# Patient Record
Sex: Male | Born: 1975 | Race: Black or African American | Hispanic: No | Marital: Married | Smoking: Current every day smoker
Health system: Southern US, Community
[De-identification: ages and names within clinical notes are randomized; demographics above are authoritative.]

---

## 2002-09-23 HISTORY — PX: OTHER SURGICAL HISTORY: SHX169

## 2008-07-19 ENCOUNTER — Emergency Department (HOSPITAL_BASED_OUTPATIENT_CLINIC_OR_DEPARTMENT_OTHER): Admission: EM | Admit: 2008-07-19 | Discharge: 2008-07-19 | Payer: Self-pay | Admitting: Emergency Medicine

## 2008-10-28 ENCOUNTER — Emergency Department (HOSPITAL_BASED_OUTPATIENT_CLINIC_OR_DEPARTMENT_OTHER): Admission: EM | Admit: 2008-10-28 | Discharge: 2008-10-28 | Payer: Self-pay | Admitting: Emergency Medicine

## 2008-10-28 ENCOUNTER — Ambulatory Visit: Payer: Self-pay | Admitting: Diagnostic Radiology
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
## 2012-11-09 ENCOUNTER — Encounter (HOSPITAL_BASED_OUTPATIENT_CLINIC_OR_DEPARTMENT_OTHER): Payer: Self-pay | Admitting: *Deleted

## 2012-11-09 ENCOUNTER — Emergency Department (HOSPITAL_BASED_OUTPATIENT_CLINIC_OR_DEPARTMENT_OTHER)
Admission: EM | Admit: 2012-11-09 | Discharge: 2012-11-09 | Disposition: A | Discharge status: Home / Self Care | Payer: 59 | Attending: Emergency Medicine | Admitting: Emergency Medicine

## 2012-11-09 DIAGNOSIS — IMO0001 Reserved for inherently not codable concepts without codable children: Secondary | ICD-10-CM | POA: Insufficient documentation

## 2012-11-09 DIAGNOSIS — F17200 Nicotine dependence, unspecified, uncomplicated: Secondary | ICD-10-CM | POA: Insufficient documentation

## 2012-11-09 DIAGNOSIS — R059 Cough, unspecified: Secondary | ICD-10-CM | POA: Insufficient documentation

## 2012-11-09 DIAGNOSIS — R05 Cough: Secondary | ICD-10-CM | POA: Insufficient documentation

## 2012-11-09 DIAGNOSIS — R509 Fever, unspecified: Secondary | ICD-10-CM | POA: Insufficient documentation

## 2012-11-09 DIAGNOSIS — J029 Acute pharyngitis, unspecified: Secondary | ICD-10-CM | POA: Insufficient documentation

## 2012-11-09 DIAGNOSIS — R51 Headache: Secondary | ICD-10-CM | POA: Insufficient documentation

## 2012-11-09 MED ORDER — KETOROLAC TROMETHAMINE 30 MG/ML IJ SOLN
30.0000 mg | Freq: Once | INTRAMUSCULAR | Status: AC
  Administered 2012-11-09: 30 mg via INTRAVENOUS
  Filled 2012-11-09: qty 1

## 2012-11-09 MED ORDER — LIDOCAINE VISCOUS 2 % MT SOLN
20.0000 mL | Freq: Once | OROMUCOSAL | Status: AC
  Administered 2012-11-09: 15 mL via OROMUCOSAL
  Filled 2012-11-09: qty 15

## 2012-11-09 MED ORDER — DEXAMETHASONE 1 MG/ML PO CONC
10.0000 mg | Freq: Once | ORAL | Status: AC
  Administered 2012-11-09: 10 mg via ORAL
  Filled 2012-11-09: qty 1

## 2012-11-09 NOTE — ED Provider Notes (Signed)
History  This chart was scribed for Gerhard Munch, MD by Bennett Scrape, ED Scribe. This patient was seen in room [facility]1/[facility]1 and the patient's care was started at 10:25 PM.  CSN: [id number]  Arrival date & time 11/09/12  2046   First MD Initiated Contact with Patient 11/09/12 2225      Chief Complaint  Patient presents with  . Sore Throat     The history is provided by the patient. No language interpreter was used.   Jack Cain is a 37 y.o. male who presents to the Emergency Department complaining of gradual onset, gradually worsening, constant sore throat with associated subjective fevers, generalized HA, non-productive cough and myalgias that started yesterday. He reports taking Nyquil with no improvement. He denies rash, emesis, confusion, speech difficulty, trouble swallowing and syncope as associated symptoms. He does not have a h/o chronic medical conditions and is a current everyday smoker and occasional alcohol user.   History reviewed. No pertinent past medical history.  History reviewed. No pertinent past surgical history.  No family history on file.  History  Substance Use Topics  . Smoking status: Current Every Day Smoker -- 0.50 packs/day    Types: Cigarettes  . Smokeless tobacco: Not on file  . Alcohol Use: Yes      Review of Systems  Constitutional:       Per HPI, otherwise negative  HENT:       Per HPI, otherwise negative  Respiratory:       Per HPI, otherwise negative  Cardiovascular:       Per HPI, otherwise negative  Gastrointestinal: Negative for vomiting.  Endocrine:       Negative aside from HPI  Genitourinary:       Neg aside from HPI   Musculoskeletal:       Per HPI, otherwise negative  Skin: Negative.   Neurological: Positive for headaches. Negative for syncope.    Allergies  Review of patient's allergies indicates no known allergies.  Home Medications  No current outpatient prescriptions on file.  Triage Vitals: BP 128/76   Pulse 87  Temp(Src) 99.3 F (37.4 C) (Oral)  Resp 20  Wt 240 lb (108.863 kg)  SpO2 100%  Physical Exam  Nursing note and vitals reviewed. Constitutional: He is oriented to person, place, and time. He appears well-developed and well-nourished. No distress.  HENT:  Head: Normocephalic and atraumatic.  Mild oropharyngeal erythema, no exudate  Eyes: Conjunctivae and EOM are normal.  Cardiovascular: Normal rate and regular rhythm.   Pulmonary/Chest: Effort normal and breath sounds normal. No stridor. No respiratory distress.  Abdominal: Soft. He exhibits no distension.  Musculoskeletal: He exhibits no edema.  Lymphadenopathy:    He has no cervical adenopathy.  Neurological: He is alert and oriented to person, place, and time.  Skin: Skin is warm and dry.  Psychiatric: He has a normal mood and affect.    ED Course  Procedures (including critical care time)  DIAGNOSTIC STUDIES: Oxygen Saturation is 100% on room air, normal by my interpretation.    COORDINATION OF CARE: 10:31 PM-Discussed smoking cessation with pt and pt agreed. Discussed discharge plan which includes pain medication, PO fluids and rest with pt at bedside and pt agreed to plan.   Labs Reviewed  RAPID STREP SCREEN   No results found.   No diagnosis found.    MDM   I personally performed the services described in this documentation, which was scribed in my presence. The recorded information has been reviewed  and is accurate.   The patient presents with one day of generalized complaints, primarily concerned over a sore throat.  Patient has no dysphagia, dysesthesia, no fever, and on exam is in no distress.  Patient has no exudative pharyngitis, and a negative strep test. Patient received symptomatic medication, discharged to follow up with his primary care physician  Gerhard Munch, MD 11/09/12 2247

## 2012-11-09 NOTE — ED Notes (Signed)
Sore throat. Body aches, headache and cough since yesterday.

## 2013-02-14 ENCOUNTER — Encounter (HOSPITAL_BASED_OUTPATIENT_CLINIC_OR_DEPARTMENT_OTHER): Payer: Self-pay

## 2013-02-14 ENCOUNTER — Emergency Department (HOSPITAL_BASED_OUTPATIENT_CLINIC_OR_DEPARTMENT_OTHER)
Admission: EM | Admit: 2013-02-14 | Discharge: 2013-02-14 | Disposition: A | Discharge status: Home / Self Care | Payer: PRIVATE HEALTH INSURANCE | Attending: Emergency Medicine | Admitting: Emergency Medicine

## 2013-02-14 ENCOUNTER — Emergency Department (HOSPITAL_BASED_OUTPATIENT_CLINIC_OR_DEPARTMENT_OTHER): Payer: PRIVATE HEALTH INSURANCE

## 2013-02-14 DIAGNOSIS — F17200 Nicotine dependence, unspecified, uncomplicated: Secondary | ICD-10-CM | POA: Insufficient documentation

## 2013-02-14 DIAGNOSIS — X500XXA Overexertion from strenuous movement or load, initial encounter: Secondary | ICD-10-CM | POA: Insufficient documentation

## 2013-02-14 DIAGNOSIS — S93601A Unspecified sprain of right foot, initial encounter: Secondary | ICD-10-CM

## 2013-02-14 DIAGNOSIS — Y9302 Activity, running: Secondary | ICD-10-CM | POA: Insufficient documentation

## 2013-02-14 DIAGNOSIS — Y9289 Other specified places as the place of occurrence of the external cause: Secondary | ICD-10-CM | POA: Insufficient documentation

## 2013-02-14 DIAGNOSIS — S93609A Unspecified sprain of unspecified foot, initial encounter: Secondary | ICD-10-CM | POA: Insufficient documentation

## 2013-02-14 MED ORDER — IBUPROFEN 600 MG PO TABS: 600.0000 mg | ORAL_TABLET | Freq: Four times a day (QID) | ORAL | Status: DC | PRN

## 2013-02-14 NOTE — ED Notes (Signed)
Patient here with right heel pain after reporting that he was chasing kids on pavement and pain started immediately, he heard a pop to same. On exam tender to palpation and ambulation, swelling noted to right heel. Positive pulses

## 2013-02-14 NOTE — ED Provider Notes (Signed)
History     CSN: [id number]  Arrival date & time 02/14/13  5621   First MD Initiated Contact with Patient 02/14/13 [phone]      Chief Complaint  Patient presents with  . Foot Injury    (Consider location/radiation/quality/duration/timing/severity/associated sxs/prior treatment) HPI Comments: Patient presents with right foot pain. He states yesterday he was chasing his kids on the pavement and felt a pop on the bottom of his foot. He complains of pain in this area. Then constant since yesterday. He denies any other injuries.  He has not taken anything for the pain.  Patient is a 37 y.o. male presenting with foot injury.  Foot Injury Associated symptoms: no back pain, no fever and no neck pain     History reviewed. No pertinent past medical history.  History reviewed. No pertinent past surgical history.  No family history on file.  History  Substance Use Topics  . Smoking status: Current Every Day Smoker -- 0.50 packs/day    Types: Cigarettes  . Smokeless tobacco: Not on file  . Alcohol Use: Yes      Review of Systems  Constitutional: Negative for fever.  HENT: Negative for neck pain.   Gastrointestinal: Negative for nausea and vomiting.  Musculoskeletal: Positive for joint swelling and arthralgias. Negative for back pain.  Skin: Negative for wound.  Neurological: Negative for weakness, numbness and headaches.    Allergies  Review of patient's allergies indicates no known allergies.  Home Medications   Current Outpatient Rx  Name  Route  Sig  Dispense  Refill  . ibuprofen (ADVIL,MOTRIN) 600 MG tablet   Oral   Take 1 tablet (600 mg total) by mouth every 6 (six) hours as needed for pain.   30 tablet   0     There were no vitals taken for this visit.  Physical Exam  Constitutional: He is oriented to person, place, and time. He appears well-developed and well-nourished.  HENT:  Head: Normocephalic and atraumatic.  Neck: Normal range of motion. Neck supple.   Cardiovascular: Normal rate.   Pulmonary/Chest: Effort normal.  Musculoskeletal: He exhibits edema and tenderness.  Patient has some mild swelling over his heel. He has no pain over the Achilles tendon and it feels intact. He has pain over the plantar surface of the foot at the insertion of the plantar fascia into the calcaneus. He has no other bony tenderness to the foot or the ankle. He has normal sensation in the foot. Has normal motor function in the foot. Pulses are intact.  Neurological: He is alert and oriented to person, place, and time.  Skin: Skin is warm and dry.  Psychiatric: He has a normal mood and affect.    ED Course  Procedures (including critical care time)  Labs Reviewed - No data to display Dg Foot Complete Right  02/14/2013   *RADIOLOGY REPORT*  Clinical Data: Injury  RIGHT FOOT COMPLETE - 3+ VIEW  Comparison: None.  Findings: Small spur at the inferior calcaneus.  No acute fracture and no dislocation.  IMPRESSION: No acute bony pathology.   Original Report Authenticated By: Jolaine Click, M.D.     1. Foot sprain, right, initial encounter       MDM  Patient was placed in a postop shoe. he was instructed in ice and elevation. He was instructed to use ibuprofen for pain. He's advised to followup with Dr. Pearletha Forge if his symptoms are not improving.        Rolan Bucco, MD 02/14/13  0736 

## 2013-02-23 ENCOUNTER — Encounter: Payer: Self-pay | Admitting: Family Medicine

## 2013-02-23 ENCOUNTER — Ambulatory Visit (INDEPENDENT_AMBULATORY_CARE_PROVIDER_SITE_OTHER): Payer: PRIVATE HEALTH INSURANCE | Admitting: Family Medicine

## 2013-02-23 VITALS — BP 113/79 | HR 79 | Ht 69.0 in | Wt 245.0 lb

## 2013-02-23 DIAGNOSIS — M722 Plantar fascial fibromatosis: Secondary | ICD-10-CM

## 2013-02-23 NOTE — Patient Instructions (Addendum)
You have plantar fasciitis Take tylenol or aleve as needed for pain  Plantar fascia stretch for 20-30 seconds (do 3 of these) in morning Lowering/raise on a step exercises 3 x 10 once a day - this is very important for long term recovery. Can add heel walks, toe walks forward and backward as well Ice heel for 15 minutes as needed. Avoid flat shoes/barefoot walking as much as possible. Arch straps have been shown to help with pain. Orthotics with heel lift may be helpful - try Dr. Jari Sportsman Active series insoles. Steroid injection is a consideration for short term pain relief if you are struggling. Physical therapy is also an option. Follow up with me in 6 weeks or as needed.

## 2013-02-24 ENCOUNTER — Encounter: Payer: Self-pay | Admitting: Family Medicine

## 2013-02-24 DIAGNOSIS — M722 Plantar fascial fibromatosis: Secondary | ICD-10-CM | POA: Insufficient documentation

## 2013-02-24 NOTE — Assessment & Plan Note (Signed)
Left plantar fasciitis - home exercise program demonstrated.  Icing, tylenol/aleve as needed.  Avoid flip flops and barefoot walking.  Arch straps.  Discussed OTC insoles with arch support.  Consider PT, injection if not improving as expected.  F/u in 6 weeks.

## 2013-02-24 NOTE — Progress Notes (Signed)
  Subjective:    Patient ID: Jack Cain, male    DOB: [birth year], 37 y.o.   MRN: [id number]  PCP: None  HPI 37 yo M here for left foot/heel pain.  Patient states he started getting pain in right > left heel about 1 year ago. Now left side is worse than right. Pain worse in mornings and eases up during day. Flared up about 2 weeks ago when he stopped a short run forward. Some swelling but no bruising. Stands a lot for work. Has been icing. Not tried any exercises, inserts. Had an injection remotely without much benefit.  History reviewed. No pertinent past medical history.  Current Outpatient Prescriptions on File Prior to Visit  Medication Sig Dispense Refill  . ibuprofen (ADVIL,MOTRIN) 600 MG tablet Take 1 tablet (600 mg total) by mouth every 6 (six) hours as needed for pain.  30 tablet  0   No current facility-administered medications on file prior to visit.    History reviewed. No pertinent past surgical history.  No Known Allergies  History   Social History  . Marital Status: Married    Spouse Name: N/A    Number of Children: N/A  . Years of Education: N/A   Occupational History  . Not on file.   Social History Main Topics  . Smoking status: Current Every Day Smoker -- 0.50 packs/day    Types: Cigarettes  . Smokeless tobacco: Not on file  . Alcohol Use: Yes  . Drug Use: No  . Sexually Active: Not on file   Other Topics Concern  . Not on file   Social History Narrative  . No narrative on file    Family History  Problem Relation Age of Onset  . Sudden death Neg Hx   . Hypertension Neg Hx   . Hyperlipidemia Neg Hx   . Heart attack Neg Hx   . Diabetes Neg Hx     BP 113/79  Pulse 79  Ht 5\' 9"  (1.753 m)  Wt 245 lb (111.131 kg)  BMI 36.16 kg/m2  Review of Systems See HPI above.    Objective:   Physical Exam Gen: NAD  L foot/ankle: No gross deformity, swelling, ecchymoses Mod overpronation. FROM with 5/5 strength all directions. TTP  plantar anterior calcaneus at PF insertion. Negative ant drawer and talar tilt.   Negative syndesmotic compression. Thompsons test negative. NV intact distally.    Assessment & Plan:  1. Left plantar fasciitis - home exercise program demonstrated.  Icing, tylenol/aleve as needed.  Avoid flip flops and barefoot walking.  Arch straps.  Discussed OTC insoles with arch support.  Consider PT, injection if not improving as expected.  F/u in 6 weeks.

## 2013-03-16 ENCOUNTER — Other Ambulatory Visit: Payer: Self-pay | Admitting: Urology

## 2013-04-21 ENCOUNTER — Encounter (HOSPITAL_BASED_OUTPATIENT_CLINIC_OR_DEPARTMENT_OTHER): Payer: Self-pay | Admitting: *Deleted

## 2013-04-21 NOTE — Progress Notes (Signed)
NPO AFTER MN. ARRIVES AT 0700. NEEDS HG. 

## 2013-04-24 ENCOUNTER — Emergency Department (HOSPITAL_BASED_OUTPATIENT_CLINIC_OR_DEPARTMENT_OTHER)
Admission: EM | Admit: 2013-04-24 | Discharge: 2013-04-24 | Disposition: A | Discharge status: Home / Self Care | Payer: PRIVATE HEALTH INSURANCE | Attending: Emergency Medicine | Admitting: Emergency Medicine

## 2013-04-24 ENCOUNTER — Encounter (HOSPITAL_BASED_OUTPATIENT_CLINIC_OR_DEPARTMENT_OTHER): Payer: Self-pay | Admitting: *Deleted

## 2013-04-24 DIAGNOSIS — H109 Unspecified conjunctivitis: Secondary | ICD-10-CM | POA: Insufficient documentation

## 2013-04-24 DIAGNOSIS — F17200 Nicotine dependence, unspecified, uncomplicated: Secondary | ICD-10-CM | POA: Insufficient documentation

## 2013-04-24 DIAGNOSIS — H5789 Other specified disorders of eye and adnexa: Secondary | ICD-10-CM | POA: Insufficient documentation

## 2013-04-24 DIAGNOSIS — H579 Unspecified disorder of eye and adnexa: Secondary | ICD-10-CM | POA: Insufficient documentation

## 2013-04-24 MED ORDER — ERYTHROMYCIN 5 MG/GM OP OINT: TOPICAL_OINTMENT | OPHTHALMIC | Status: AC

## 2013-04-24 NOTE — ED Notes (Signed)
Right eye irritation/pink for the last week, congestion in eye when he wakes up

## 2013-04-24 NOTE — ED Provider Notes (Signed)
  CSN: [id number]     Arrival date & time 04/24/13  0908 History     First MD Initiated Contact with Patient 04/24/13 0915     Chief Complaint  Patient presents with  . Eye Pain    Patient is a 37 y.o. male presenting with eye pain. The history is provided by the patient.  Eye Pain This is a new problem. The current episode started more than 1 week ago. The problem occurs constantly. The problem has not changed since onset.Pertinent negatives include no headaches. Nothing aggravates the symptoms. Nothing relieves the symptoms.  denies trauma He does not wear contact lenses Reports discharge in right eye every morning  PMH - none Past Surgical History  Procedure Laterality Date  . Excision right wrist ganglion cyst  2004   Family History  Problem Relation Age of Onset  . Sudden death Neg Hx   . Hypertension Neg Hx   . Hyperlipidemia Neg Hx   . Heart attack Neg Hx   . Diabetes Neg Hx    History  Substance Use Topics  . Smoking status: Current Every Day Smoker -- 1.00 packs/day for 7 years    Types: Cigarettes  . Smokeless tobacco: Not on file  . Alcohol Use: Yes     Comment: OCCASIONAL    Review of Systems  Constitutional: Negative for fever.  Eyes: Positive for pain, discharge, redness and itching. Negative for photophobia and visual disturbance.  Neurological: Negative for headaches.    Allergies  Review of patient's allergies indicates no known allergies.  Home Medications   Current Outpatient Rx  Name  Route  Sig  Dispense  Refill  . erythromycin ophthalmic ointment      Place a 1/2 inch ribbon of ointment into the lower eyelid.   1 g   0    BP 121/86  Pulse 66  Temp(Src) 98.1 F (36.7 C)  SpO2 96% Physical Exam CONSTITUTIONAL: Well developed/well nourished HEAD AND FACE: Normocephalic/atraumatic EYES: EOMI/PERRL. No consensual pain. No proptosis.  No foreign body noted in OD.  Yellowish discharge noted in right lid margin.  Mild conjunctival erythema  noted in OD.  No photophobia ENMT: Mucous membranes moist.  NECK: supple no meningeal signs LUNGS:  no apparent distress ABDOMEN: soft NEURO: Pt is awake/alert, moves all extremitiesx4 EXTREMITIES:full ROM SKIN: warm, color normal  ED Course   Procedures 1. Conjunctivitis     MDM  Nursing notes including past medical history and social history reviewed and considered in documentation  I doubt iritis.  Will treat with erythromycin and f/u with ophtho if not improved in one week  Joya Gaskins, MD 04/24/13 1002

## 2013-04-27 ENCOUNTER — Encounter (HOSPITAL_BASED_OUTPATIENT_CLINIC_OR_DEPARTMENT_OTHER): Payer: Self-pay | Admitting: Anesthesiology

## 2013-04-27 NOTE — Anesthesia Preprocedure Evaluation (Deleted)
Anesthesia Evaluation  Patient identified by MRN, date of birth, ID band Patient awake    Reviewed: Allergy & Precautions, H&P , NPO status , Patient's Chart, lab work & pertinent test results  Airway Mallampati: II TM Distance: >3 FB Neck ROM: Full    Dental no notable dental hx.    Pulmonary Current Smoker,  breath sounds clear to auscultation  Pulmonary exam normal       Cardiovascular Exercise Tolerance: Good negative cardio ROS  Rhythm:Regular Rate:Normal     Neuro/Psych negative neurological ROS  negative psych ROS   GI/Hepatic negative GI ROS, Neg liver ROS,   Endo/Other  negative endocrine ROS  Renal/GU negative Renal ROS  negative genitourinary   Musculoskeletal negative musculoskeletal ROS (+)   Abdominal   Peds negative pediatric ROS (+)  Hematology negative hematology ROS (+)   Anesthesia Other Findings   Reproductive/Obstetrics negative OB ROS                           Anesthesia Physical Anesthesia Plan  ASA: II  Anesthesia Plan: MAC   Post-op Pain Management:    Induction: Intravenous  Airway Management Planned:   Additional Equipment:   Intra-op Plan:   Post-operative Plan:   Informed Consent: I have reviewed the patients History and Physical, chart, labs and discussed the procedure including the risks, benefits and alternatives for the proposed anesthesia with the patient or authorized representative who has indicated his/her understanding and acceptance.   Dental advisory given  Plan Discussed with: CRNA  Anesthesia Plan Comments:        Anesthesia Quick Evaluation

## 2013-04-28 ENCOUNTER — Ambulatory Visit (HOSPITAL_BASED_OUTPATIENT_CLINIC_OR_DEPARTMENT_OTHER): Admission: RE | Admit: 2013-04-28 | Payer: 59 | Source: Ambulatory Visit | Admitting: Urology

## 2013-04-28 SURGERY — VASECTOMY
Anesthesia: Monitor Anesthesia Care | Laterality: Bilateral

## 2013-04-28 NOTE — Progress Notes (Signed)
Patient was scheduled for bilateral vasectomy in the OR 04/28/13. He did not show for the surgery. Attempts to contact the patient were unsuccessful.

## 2014-05-16 ENCOUNTER — Encounter (HOSPITAL_BASED_OUTPATIENT_CLINIC_OR_DEPARTMENT_OTHER): Payer: Self-pay | Admitting: Emergency Medicine

## 2014-05-16 ENCOUNTER — Emergency Department (HOSPITAL_BASED_OUTPATIENT_CLINIC_OR_DEPARTMENT_OTHER)
Admission: EM | Admit: 2014-05-16 | Discharge: 2014-05-16 | Disposition: A | Discharge status: Home / Self Care | Payer: BC Managed Care – PPO | Attending: Emergency Medicine | Admitting: Emergency Medicine

## 2014-05-16 DIAGNOSIS — J029 Acute pharyngitis, unspecified: Secondary | ICD-10-CM | POA: Insufficient documentation

## 2014-05-16 DIAGNOSIS — F17200 Nicotine dependence, unspecified, uncomplicated: Secondary | ICD-10-CM | POA: Insufficient documentation

## 2014-05-16 DIAGNOSIS — Z791 Long term (current) use of non-steroidal anti-inflammatories (NSAID): Secondary | ICD-10-CM | POA: Diagnosis not present

## 2014-05-16 LAB — RAPID STREP SCREEN (MED CTR MEBANE ONLY): Streptococcus, Group A Screen (Direct): NEGATIVE

## 2014-05-16 MED ORDER — IBUPROFEN 800 MG PO TABS
800.0000 mg | ORAL_TABLET | Freq: Once | ORAL | Status: AC
  Administered 2014-05-16: 800 mg via ORAL
  Filled 2014-05-16: qty 1

## 2014-05-16 MED ORDER — NAPROXEN 500 MG PO TABS: 500.0000 mg | ORAL_TABLET | Freq: Two times a day (BID) | ORAL | Status: AC

## 2014-05-16 NOTE — ED Notes (Signed)
Pt reports sore throat that began last Thursday, pain improved and then returned yesterday - pt admits to a productive cough w/ yellow sputum as well. Unsure of any fever.

## 2014-05-16 NOTE — ED Provider Notes (Signed)
CSN: [id number]     Arrival date & time 05/16/14  0043 History   First MD Initiated Contact with Patient 05/16/14 0103     Chief Complaint  Patient presents with  . Sore Throat     (Consider location/radiation/quality/duration/timing/severity/associated sxs/prior Treatment) Patient is a 38 y.o. male presenting with pharyngitis. The history is provided by the patient.  Sore Throat This is a new problem. The current episode started more than 2 days ago. The problem occurs constantly. The problem has not changed since onset.Pertinent negatives include no chest pain, no abdominal pain, no headaches and no shortness of breath. Nothing aggravates the symptoms. Nothing relieves the symptoms. He has tried nothing for the symptoms. The treatment provided no relief.    History reviewed. No pertinent past medical history. Past Surgical History  Procedure Laterality Date  . Excision right wrist ganglion cyst  2004   Family History  Problem Relation Age of Onset  . Sudden death Neg Hx   . Hypertension Neg Hx   . Hyperlipidemia Neg Hx   . Heart attack Neg Hx   . Diabetes Neg Hx    History  Substance Use Topics  . Smoking status: Current Every Day Smoker -- 1.00 packs/day for 7 years    Types: Cigarettes  . Smokeless tobacco: Not on file  . Alcohol Use: Yes     Comment: OCCASIONAL    Review of Systems  Constitutional: Negative for fever.  Respiratory: Negative for shortness of breath.   Cardiovascular: Negative for chest pain.  Gastrointestinal: Negative for abdominal pain.  Neurological: Negative for headaches.  All other systems reviewed and are negative.     Allergies  Review of patient's allergies indicates no known allergies.  Home Medications   Prior to Admission medications   Medication Sig Start Date End Date Taking? Authorizing Provider  erythromycin ophthalmic ointment Place a 1/2 inch ribbon of ointment into the lower eyelid. 04/24/13   Joya Gaskins, MD   naproxen (NAPROSYN) 500 MG tablet Take 1 tablet (500 mg total) by mouth 2 (two) times daily. 05/16/14   Ridwan Bondy K Valree Feild-Rasch, MD   BP 129/81  Pulse 69  Temp(Src) 98 F (36.7 C) (Oral)  Resp 20  Ht  (1.753 m)  Wt 250 lb (113.399 kg)  BMI 36.90 kg/m2  SpO2 100% Physical Exam  Constitutional: He is oriented to person, place, and time. He appears well-developed and well-nourished. No distress.  HENT:  Head: Normocephalic and atraumatic.  Mouth/Throat: Oropharynx is clear and moist. No oropharyngeal exudate.  Eyes: Conjunctivae are normal. Pupils are equal, round, and reactive to light.  Neck: Normal range of motion.  No pain with displacement of the trachea, intact phonation  Cardiovascular: Normal rate, regular rhythm and intact distal pulses.   Pulmonary/Chest: Effort normal and breath sounds normal. He has no wheezes. He has no rales.  Abdominal: Soft. Bowel sounds are normal. There is no tenderness. There is no rebound and no guarding.  Musculoskeletal: Normal range of motion.  Lymphadenopathy:    He has no cervical adenopathy.  Neurological: He is alert and oriented to person, place, and time. He has normal reflexes.  Skin: Skin is warm and dry.  Psychiatric: He has a normal mood and affect.    ED Course  Procedures (including critical care time) Labs Review Labs Reviewed  RAPID STREP SCREEN  CULTURE, GROUP A STREP    Imaging Review No results found.   EKG Interpretation None      MDM  Final diagnoses:  Viral pharyngitis    Viral pharyngitis  NSAIDs and salt water gargles    Shalawn Wynder K Filiberto Wamble-Rasch, MD 05/16/14 2306

## 2014-05-16 NOTE — ED Notes (Signed)
Pt ambulating independently w/ steady gait on d/c in no acute distress, A&Ox4. D/c instructions reviewed w/ pt - pt denies any further questions or concerns at present. Rx given x1  

## 2014-05-18 LAB — CULTURE, GROUP A STREP

## 2017-12-26 ENCOUNTER — Encounter (HOSPITAL_BASED_OUTPATIENT_CLINIC_OR_DEPARTMENT_OTHER): Payer: Self-pay | Admitting: *Deleted

## 2017-12-26 ENCOUNTER — Emergency Department (HOSPITAL_BASED_OUTPATIENT_CLINIC_OR_DEPARTMENT_OTHER)
Admission: EM | Admit: 2017-12-26 | Discharge: 2017-12-26 | Disposition: A | Discharge status: Home / Self Care | Payer: 59 | Attending: Emergency Medicine | Admitting: Emergency Medicine

## 2017-12-26 ENCOUNTER — Emergency Department (HOSPITAL_BASED_OUTPATIENT_CLINIC_OR_DEPARTMENT_OTHER): Payer: 59

## 2017-12-26 ENCOUNTER — Other Ambulatory Visit: Payer: Self-pay

## 2017-12-26 DIAGNOSIS — Z79899 Other long term (current) drug therapy: Secondary | ICD-10-CM | POA: Diagnosis not present

## 2017-12-26 DIAGNOSIS — J069 Acute upper respiratory infection, unspecified: Secondary | ICD-10-CM | POA: Insufficient documentation

## 2017-12-26 DIAGNOSIS — F17210 Nicotine dependence, cigarettes, uncomplicated: Secondary | ICD-10-CM | POA: Diagnosis not present

## 2017-12-26 DIAGNOSIS — R05 Cough: Secondary | ICD-10-CM | POA: Diagnosis present

## 2017-12-26 MED ORDER — AZITHROMYCIN 250 MG PO TABS: 250.0000 mg | ORAL_TABLET | Freq: Every day | ORAL | 0 refills | Status: AC

## 2017-12-26 MED ORDER — FLUTICASONE PROPIONATE 50 MCG/ACT NA SUSP: 2.0000 | Freq: Every day | NASAL | 0 refills | Status: AC

## 2017-12-26 NOTE — ED Provider Notes (Signed)
[facility] EMERGENCY DEPARTMENT Provider Note   CSN: [id number] Arrival date & time: 12/26/17  2149     History   Chief Complaint Chief Complaint  Patient presents with  . Cough    HPI Jack Cain is a 42 y.o. male.  HPI   Patient is a 42 year old male presents the ED today complaining of cough, nasal congestion, rhinorrhea, sore throat, body aches, fevers that began 5 days ago.  Cough has been productive of yellow/green sputum.  No hemoptysis.  States he has not had a fever in 4 days.  States his symptoms do seem to be improving.  Has been able to tolerate p.o. at home.  States he has had a raspy voice, has not lost his voice.  States her throat is not severe.  Is also complaining of bilateral ear pain.  No chest pain, shortness of breath, abdominal pain, nausea, vomiting, diarrhea, back pain.  Patient smokes tobacco  History reviewed. No pertinent past medical history.  Patient Active Problem List   Diagnosis Date Noted  . Plantar fasciitis of left foot 02/24/2013    Past Surgical History:  Procedure Laterality Date  . EXCISION RIGHT WRIST GANGLION CYST  2004        Home Medications    Prior to Admission medications   Medication Sig Start Date End Date Taking? Authorizing Provider  azithromycin (ZITHROMAX Z-PAK) 250 MG tablet Take 1 tablet (250 mg total) by mouth daily. Take 2 tablets on the first day of treatment. Then take 1 tablet per day for the next four days. 12/26/17   Shann Lewellyn S, PA-C  erythromycin ophthalmic ointment Place a 1/2 inch ribbon of ointment into the lower eyelid. 04/24/13   Zadie Rhine, MD  fluticasone (FLONASE) 50 MCG/ACT nasal spray Place 2 sprays into both nostrils daily. 12/26/17   Quaneshia Wareing S, PA-C  naproxen (NAPROSYN) 500 MG tablet Take 1 tablet (500 mg total) by mouth 2 (two) times daily. 05/16/14   Palumbo, April, MD    Family History Family History  Problem Relation Age of Onset  . Sudden death Neg Hx   .  Hypertension Neg Hx   . Hyperlipidemia Neg Hx   . Heart attack Neg Hx   . Diabetes Neg Hx     Social History Social History   Tobacco Use  . Smoking status: Current Every Day Smoker    Packs/day: 1.00    Years: 7.00    Pack years: 7.00    Types: Cigarettes  Substance Use Topics  . Alcohol use: Yes    Comment: OCCASIONAL  . Drug use: No     Allergies   Patient has no known allergies.   Review of Systems Review of Systems  Constitutional: Positive for fever.  HENT: Positive for congestion, ear pain, postnasal drip, rhinorrhea and sore throat. Negative for trouble swallowing.   Eyes: Negative for visual disturbance.  Respiratory: Positive for cough. Negative for shortness of breath.   Cardiovascular: Negative for chest pain and palpitations.  Gastrointestinal: Negative for abdominal pain, constipation, diarrhea, nausea and vomiting.  Genitourinary: Negative for flank pain.  Musculoskeletal: Negative for back pain and neck pain.  Skin: Negative for rash.  Neurological: Negative for dizziness, weakness, light-headedness, numbness and headaches.     Physical Exam Updated Vital Signs BP 113/75 (BP Location: Right Arm)   Pulse 65   Temp 98.5 F (36.9 C) (Oral)   Resp 18   Ht 5\' 9"  (1.753 m)   Wt 131.5 kg (  290 lb)   SpO2 98%   BMI 42.83 kg/m   Physical Exam  Constitutional: He is oriented to person, place, and time. He appears well-developed and well-nourished.  Non-Toxic, no acute distress  HENT:  Head: Normocephalic and atraumatic.  Lateral TMs without erythema.  Right TM is bulging.  Bilateral nasal turbinates swollen.  Pharyngeal erythema noted.  Mild tonsillar swelling, no tonsillar kissing.  No tonsillar exudate.  Uvula midline.  No evidence of PTA.  No hot potato voice.  Tolerating secretions.  Patent airway.  Eyes: Pupils are equal, round, and reactive to light. Conjunctivae and EOM are normal.  Neck: Normal range of motion. Neck supple.  Cardiovascular:  Normal rate, regular rhythm and normal heart sounds.  No murmur heard. Pulmonary/Chest: Effort normal and breath sounds normal. No respiratory distress. He has no wheezes.  Abdominal: Soft. Bowel sounds are normal. He exhibits no distension. There is no tenderness.  Musculoskeletal: He exhibits no edema.  Lymphadenopathy:    He has no cervical adenopathy.  Neurological: He is alert and oriented to person, place, and time.  Skin: Skin is warm and dry. Capillary refill takes less than 2 seconds.  Psychiatric: He has a normal mood and affect.  Nursing note and vitals reviewed.    ED Treatments / Results  Labs (all labs ordered are listed, but only abnormal results are displayed) Labs Reviewed - No data to display  EKG None  Radiology Dg Chest 2 View  Result Date: 12/26/2017 CLINICAL DATA:  Fever, cough, and sore throat for the past 4 days. EXAM: CHEST - 2 VIEW COMPARISON:  None. FINDINGS: The heart size and mediastinal contours are within normal limits. Both lungs are clear. The visualized skeletal structures are unremarkable. IMPRESSION: No active cardiopulmonary disease. Electronically Signed   By: Obie DredgeWilliam T Derry M.D.   On: 12/26/2017 23:15    Procedures Procedures (including critical care time)  Medications Ordered in ED Medications - No data to display   Initial Impression / Assessment and Plan / ED Course  I have reviewed the triage vital signs and the nursing notes.  Pertinent labs & imaging results that were available during my care of the patient were reviewed by me and considered in my medical decision making (see chart for details).      Final Clinical Impressions(s) / ED Diagnoses   Final diagnoses:  Upper respiratory tract infection, unspecified type   Pt CXR negative for acute infiltrate. Patients symptoms are consistent with URI, likely viral etiology.  Will give Rx for Zithromax given patient has history of tobacco use.  Will also give Flonase for nasal  congestion.  Pt will be discharged with symptomatic treatment.  Verbalizes understanding and is agreeable with plan. CENTOR score1. Advised follow-up with PCP in 1 week. pt is hemodynamically stable & in NAD prior to dc.  ED Discharge Orders        Ordered    fluticasone (FLONASE) 50 MCG/ACT nasal spray  Daily     12/26/17 2335    azithromycin (ZITHROMAX Z-PAK) 250 MG tablet  Daily     12/26/17 2335       Karrie MeresCouture, Dallyn Bergland S, PA-C 12/26/17 2336    Terrilee FilesButler, Michael C, MD 12/27/17 1130

## 2017-12-26 NOTE — ED Notes (Signed)
Patient transported to X-ray 

## 2017-12-26 NOTE — ED Triage Notes (Signed)
Pt reports onset of coughing on Tuesday followed by body aches, sore throat, and nasal congestion. Last had Tylenol about 4 hours ago.

## 2017-12-26 NOTE — Discharge Instructions (Signed)

## 2019-12-03 IMAGING — DX DG CHEST 2V
2 series · 2 of 2 positions shown · non-contrast
Comparison: None.

CLINICAL DATA: Fever, cough, and sore throat for the past 4 days.

EXAM:
CHEST - 2 VIEW

[chest pa]
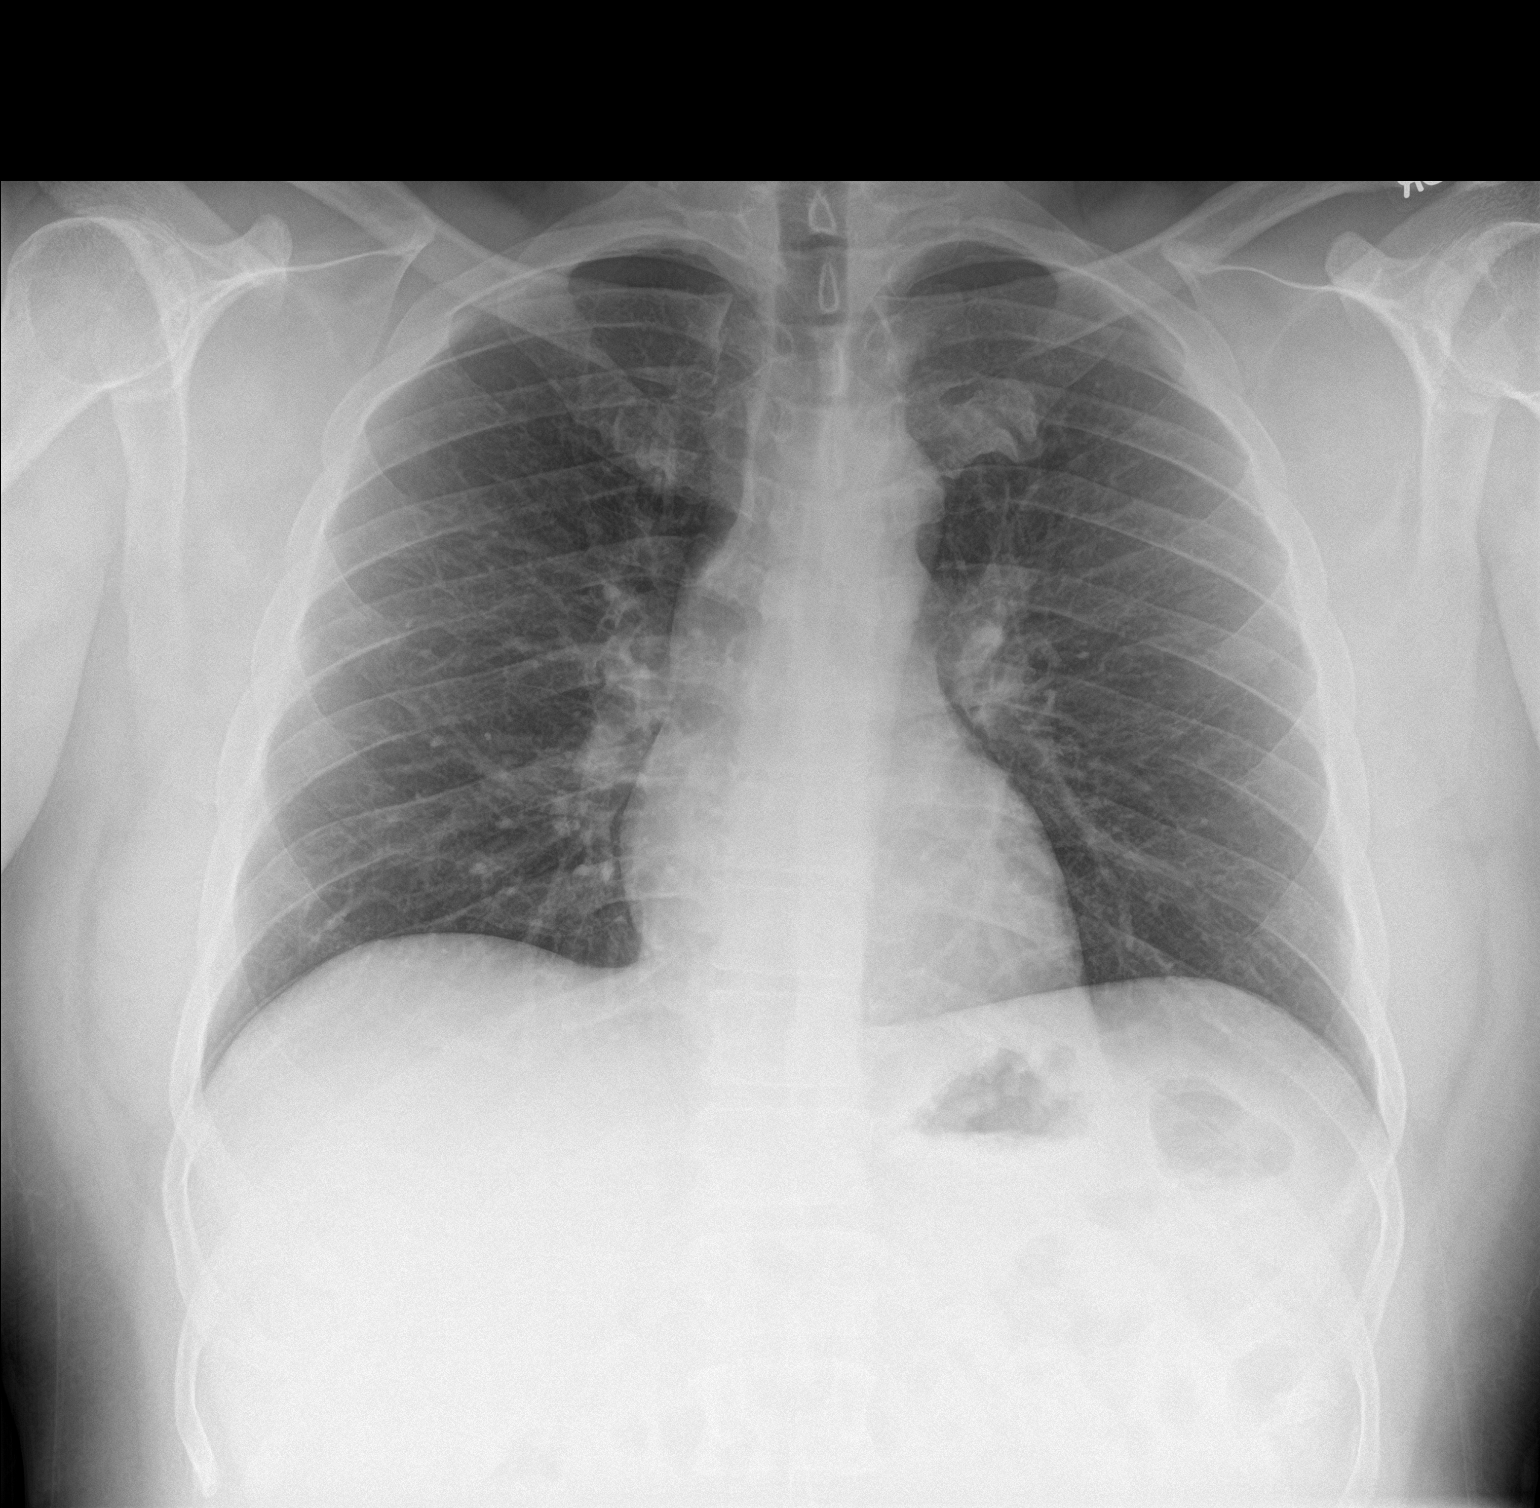

[chest lat]
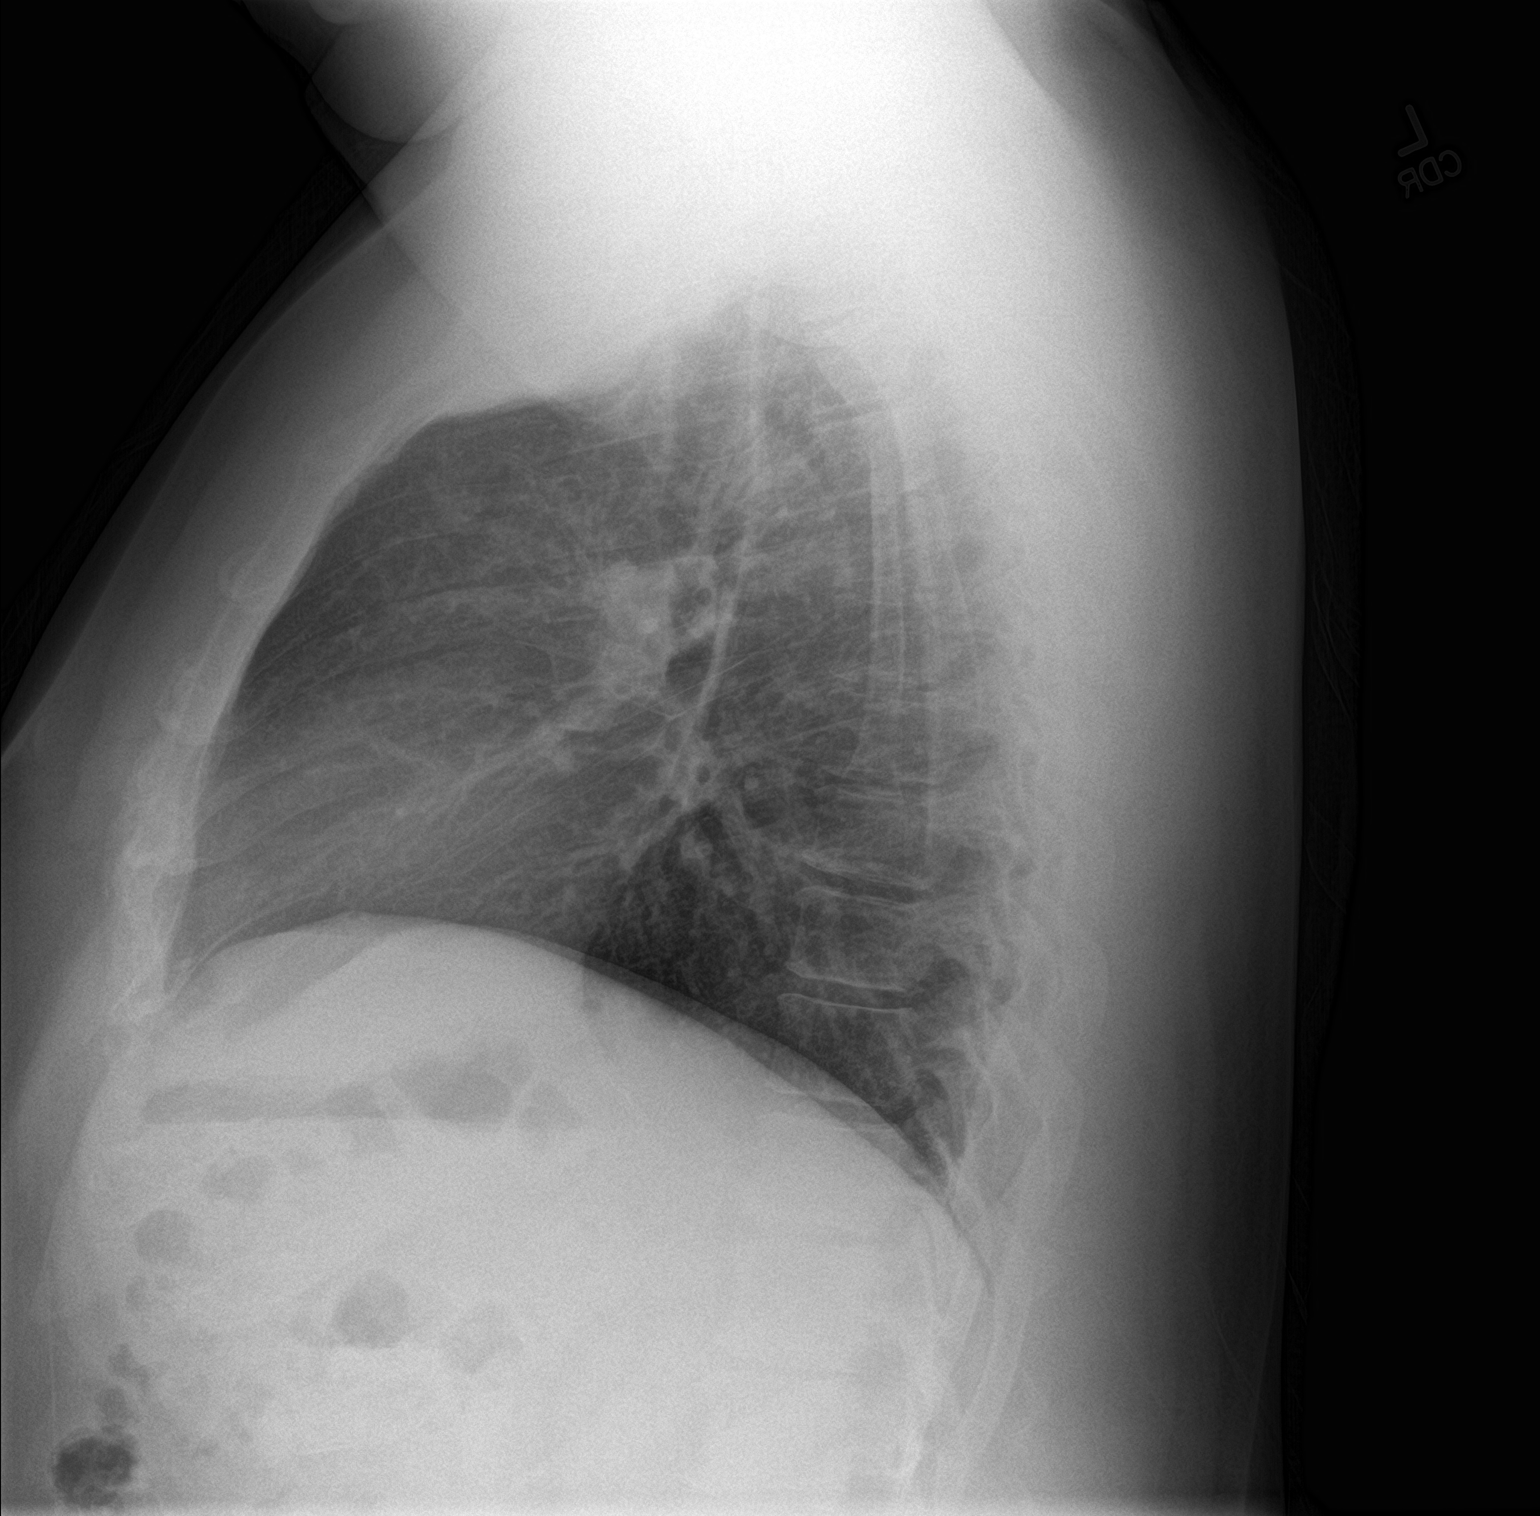

[2 of 2 positions shown; findings below may reference images not displayed]

FINDINGS: The heart size and mediastinal contours are within normal limits.
Both lungs are clear. The visualized skeletal structures are
unremarkable.
IMPRESSION: No active cardiopulmonary disease.
